# Patient Record
Sex: Male | Born: 1965 | Race: White | Hispanic: No | Marital: Married | Smoking: Never smoker
Health system: Southern US, Community
[De-identification: ages and names within clinical notes are randomized; demographics above are authoritative.]

## PROBLEM LIST (undated history)

## (undated) DIAGNOSIS — I10 Essential (primary) hypertension: Secondary | ICD-10-CM

---

## 2015-09-03 DIAGNOSIS — L0231 Cutaneous abscess of buttock: Secondary | ICD-10-CM | POA: Diagnosis not present

## 2015-09-03 DIAGNOSIS — I10 Essential (primary) hypertension: Secondary | ICD-10-CM | POA: Insufficient documentation

## 2015-09-04 ENCOUNTER — Encounter (HOSPITAL_COMMUNITY): Payer: Self-pay | Admitting: *Deleted

## 2015-09-04 ENCOUNTER — Emergency Department (HOSPITAL_COMMUNITY)
Admission: EM | Admit: 2015-09-04 | Discharge: 2015-09-04 | Disposition: A | Discharge status: Home / Self Care | Payer: BLUE CROSS/BLUE SHIELD | Attending: Emergency Medicine | Admitting: Emergency Medicine

## 2015-09-04 DIAGNOSIS — L0231 Cutaneous abscess of buttock: Secondary | ICD-10-CM

## 2015-09-04 HISTORY — DX: Essential (primary) hypertension: I10

## 2015-09-04 MED ORDER — SULFAMETHOXAZOLE-TRIMETHOPRIM 800-160 MG PO TABS: 1.0000 | ORAL_TABLET | Freq: Two times a day (BID) | ORAL | Status: AC

## 2015-09-04 MED ORDER — CEPHALEXIN 500 MG PO CAPS: 500.0000 mg | ORAL_CAPSULE | Freq: Four times a day (QID) | ORAL | Status: AC

## 2015-09-04 MED ORDER — LIDOCAINE HCL 2 % IJ SOLN
10.0000 mL | Freq: Once | INTRAMUSCULAR | Status: AC
  Administered 2015-09-04: 200 mg via INTRADERMAL
  Filled 2015-09-04: qty 20

## 2015-09-04 MED ORDER — LIDOCAINE HCL (PF) 1 % IJ SOLN: 5.0000 mL | Freq: Once | INTRAMUSCULAR | Status: DC

## 2015-09-04 NOTE — ED Provider Notes (Signed)
CSN: [id number]     Arrival date & time 09/03/15  2355 History  By signing my name below, I, Freida Busman, attest that this documentation has been prepared under the direction and in the presence of Geoffery Lyons, MD . Electronically Signed: Freida Busman, Scribe. 09/04/2015. 12:34 AM.     Chief Complaint  Patient presents with  . Abscess   The history is provided by the patient. No language interpreter was used.    HPI Comments:  James Pollard is a 49 y.o. male who presents to the Emergency Department complaining of swelling, redness and increased warmth to his right buttocks which he first noticed yesterday (09/03/2015) am. He describes a pressure sensation to the area.  He states it started as a  Itching pimple. When he returned from work it had developed into "lump" . He states it has becomes more red and grown larger since that time. No alleviating factors or associated symptoms noted.   Past Medical History  Diagnosis Date  . Hypertension    History reviewed. No pertinent past surgical history. No family history on file. Social History  Substance Use Topics  . Smoking status: Never Smoker   . Smokeless tobacco: None  . Alcohol Use: Yes    Review of Systems  Constitutional: Negative for fever and chills.  Skin: Positive for color change (erythema) and rash.  All other systems reviewed and are negative.   Allergies  Morphine and related  Home Medications   Prior to Admission medications   Not on File   BP 124/91 mmHg  Pulse 86  Temp(Src) 97.9 F (36.6 C)  Resp 18  Ht  (1.753 m)  Wt 202 lb 3 oz (91.712 kg)  BMI 29.84 kg/m2  SpO2 97% Physical Exam  Constitutional: He is oriented to person, place, and time. He appears well-developed and well-nourished. No distress.  HENT:  Head: Normocephalic and atraumatic.  Eyes: EOM are normal.  Neck: Normal range of motion.  Pulmonary/Chest: Effort normal.  Musculoskeletal: Normal range of motion.  Neurological: He is  alert and oriented to person, place, and time.  Skin: Skin is warm and dry.  There is an area of induration, erythema, and warmth to the medial aspect of the left buttock.  Psychiatric: He has a normal mood and affect. Judgment normal.  Nursing note and vitals reviewed.   ED Course  Procedures   DIAGNOSTIC STUDIES:  Oxygen Saturation is 97% on RA, normal by my interpretation.    COORDINATION OF CARE:  12:44 AM Discussed treatment plan with pt at bedside and pt agreed to plan.  12:50 AM INCISION AND DRAINAGE PROCEDURE NOTE: Patient identification was confirmed and verbal consent was obtained. This procedure was performed by Geoffery Lyons, MD at 12:50 AM. Site: left buttock Sterile procedures observed yes Needle size: 27 guage Anesthetic used (type and amt): 2% lidocaine without epi Blade size: #11 Drainage: mild Complexity: Complex Packing used: none Site anesthetized, incision made over site, wound drained and explored loculations, rinsed with copious amounts of normal saline, wound packed with sterile gauze, covered with dry, sterile dressing.  Pt tolerated procedure well without complications.  Instructions for care discussed verbally and pt provided with additional written instructions for homecare and f/u.  Labs Review Labs Reviewed - No data to display  Imaging Review No results found. I have personally reviewed and evaluated these images and lab results as part of my medical decision-making.   EKG Interpretation None  MDM   Final diagnoses:  None    Patient presents with what appears to be an abscess to the left buttock. This was incised and drained of a slight amount of pus. There is some induration and erythema of the overlying skin. This will be treated with Keflex and Bactrim and warm soaks. He is to return as needed if worsening.  I personally performed the services described in this documentation, which was scribed in my presence. The  recorded information has been reviewed and is accurate.      Geoffery Lyons, MD 09/04/15 0100

## 2015-09-04 NOTE — ED Notes (Signed)
The pt has an infected area rt buttocks since this am and the area has doubled or tripled in size since this am and is hard  Becoming more painful  also

## 2015-09-04 NOTE — Discharge Instructions (Signed)
Keflex and Bactrim as prescribed.  Perform sitz baths or warm soaks as frequently as possible for the next 2 days.  Return to the emergency department if symptoms significantly worsen or change.   Abscess An abscess is an infected area that contains a collection of pus and debris.It can occur in almost any part of the body. An abscess is also known as a furuncle or boil. CAUSES  An abscess occurs when tissue gets infected. This can occur from blockage of oil or sweat glands, infection of hair follicles, or a minor injury to the skin. As the body tries to fight the infection, pus collects in the area and creates pressure under the skin. This pressure causes pain. People with weakened immune systems have difficulty fighting infections and get certain abscesses more often.  SYMPTOMS Usually an abscess develops on the skin and becomes a painful mass that is red, warm, and tender. If the abscess forms under the skin, you may feel a moveable soft area under the skin. Some abscesses break open (rupture) on their own, but most will continue to get worse without care. The infection can spread deeper into the body and eventually into the bloodstream, causing you to feel ill.  DIAGNOSIS  Your caregiver will take your medical history and perform a physical exam. A sample of fluid may also be taken from the abscess to determine what is causing your infection. TREATMENT  Your caregiver may prescribe antibiotic medicines to fight the infection. However, taking antibiotics alone usually does not cure an abscess. Your caregiver may need to make a small cut (incision) in the abscess to drain the pus. In some cases, gauze is packed into the abscess to reduce pain and to continue draining the area. HOME CARE INSTRUCTIONS   Only take over-the-counter or prescription medicines for pain, discomfort, or fever as directed by your caregiver.  If you were prescribed antibiotics, take them as directed. Finish them even if  you start to feel better.  If gauze is used, follow your caregiver's directions for changing the gauze.  To avoid spreading the infection:  Keep your draining abscess covered with a bandage.  Wash your hands well.  Do not share personal care items, towels, or whirlpools with others.  Avoid skin contact with others.  Keep your skin and clothes clean around the abscess.  Keep all follow-up appointments as directed by your caregiver. SEEK MEDICAL CARE IF:   You have increased pain, swelling, redness, fluid drainage, or bleeding.  You have muscle aches, chills, or a general ill feeling.  You have a fever. MAKE SURE YOU:   Understand these instructions.  Will watch your condition.  Will get help right away if you are not doing well or get worse. Document Released: 09/01/2005 Document Revised: 05/23/2012 Document Reviewed: 02/04/2012 [facility] Patient Information 2015 [place], [place]. This information is not intended to replace advice given to you by your health care provider. Make sure you discuss any questions you have with your health care provider.

## 2015-11-19 ENCOUNTER — Other Ambulatory Visit: Payer: Self-pay | Admitting: Family Medicine

## 2015-11-19 DIAGNOSIS — R1011 Right upper quadrant pain: Secondary | ICD-10-CM

## 2015-11-28 ENCOUNTER — Ambulatory Visit
Admission: RE | Admit: 2015-11-28 | Discharge: 2015-11-28 | Disposition: A | Discharge status: Home / Self Care | Payer: Managed Care, Other (non HMO) | Source: Ambulatory Visit | Attending: Family Medicine | Admitting: Family Medicine

## 2015-11-28 DIAGNOSIS — R1011 Right upper quadrant pain: Secondary | ICD-10-CM

## 2018-01-30 ENCOUNTER — Other Ambulatory Visit: Payer: Self-pay | Admitting: Family Medicine

## 2018-01-30 DIAGNOSIS — R109 Unspecified abdominal pain: Secondary | ICD-10-CM

## 2018-02-06 ENCOUNTER — Other Ambulatory Visit: Payer: Self-pay | Admitting: Family Medicine

## 2018-02-08 ENCOUNTER — Other Ambulatory Visit: Payer: Managed Care, Other (non HMO)

## 2018-02-10 ENCOUNTER — Ambulatory Visit
Admission: RE | Admit: 2018-02-10 | Discharge: 2018-02-10 | Disposition: A | Discharge status: Home / Self Care | Payer: Managed Care, Other (non HMO) | Source: Ambulatory Visit | Attending: Family Medicine | Admitting: Family Medicine

## 2018-02-10 DIAGNOSIS — R109 Unspecified abdominal pain: Secondary | ICD-10-CM

## 2018-06-22 ENCOUNTER — Encounter: Payer: Self-pay | Admitting: Family Medicine

## 2018-08-11 IMAGING — US US ABDOMEN COMPLETE
1 series · 13 of 25 positions shown · non-contrast
Comparison: Abdominal ultrasound 11/28/2015.

CLINICAL DATA: Indigestion and gas after eating for several years.

EXAM:
ABDOMEN ULTRASOUND COMPLETE

[Series 1: us abdomen complete · 0.23mm/px · 96 acquisitions, 13 frames shown]
[im 1/96]
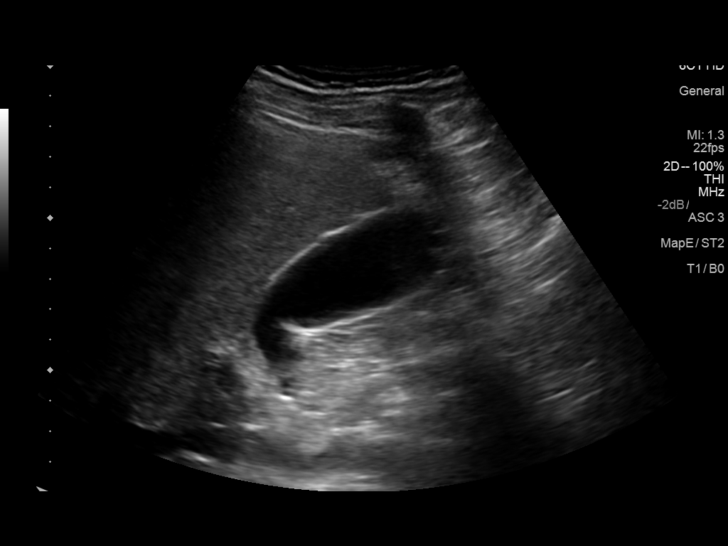
[im 8/96]
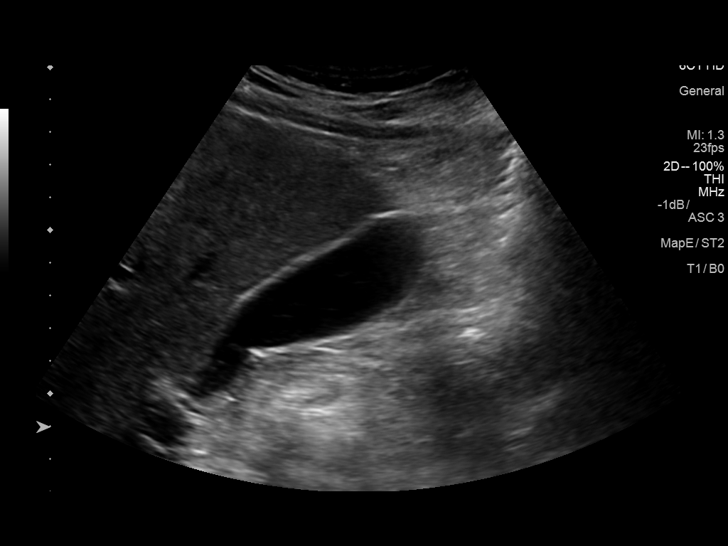
[im 16/96]
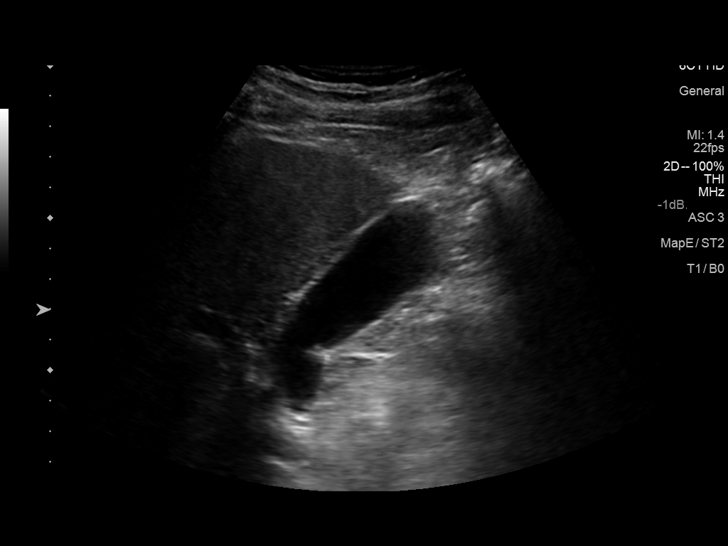
[im 24/96]
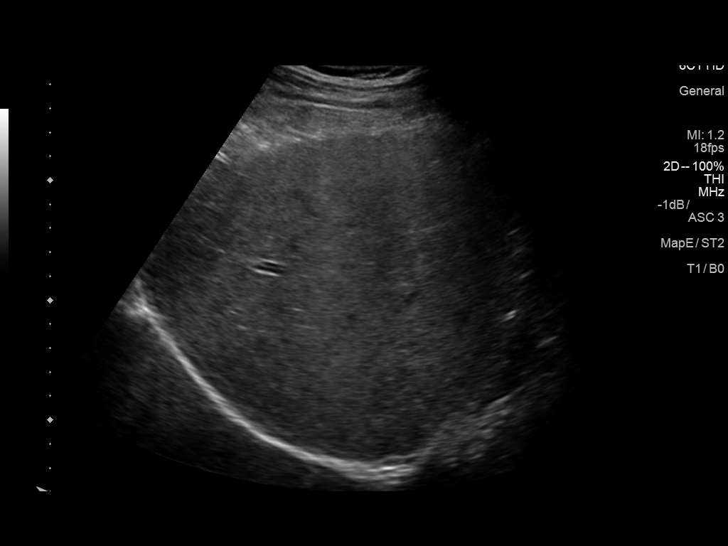
[im 32/96]
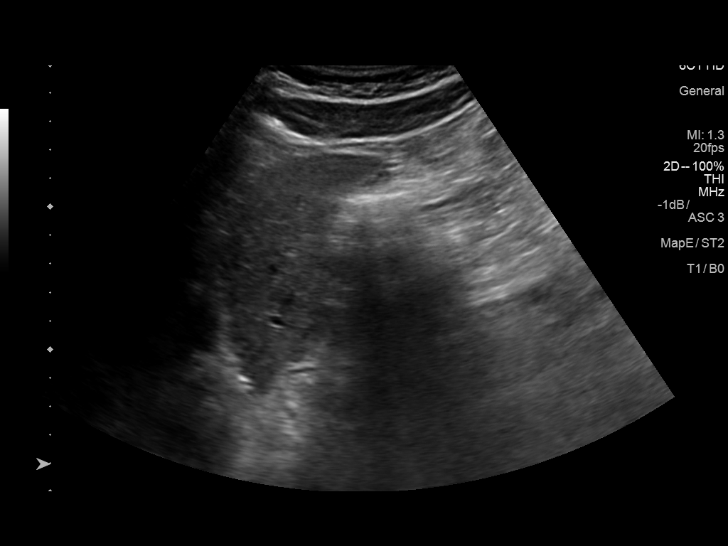
[im 40/96]
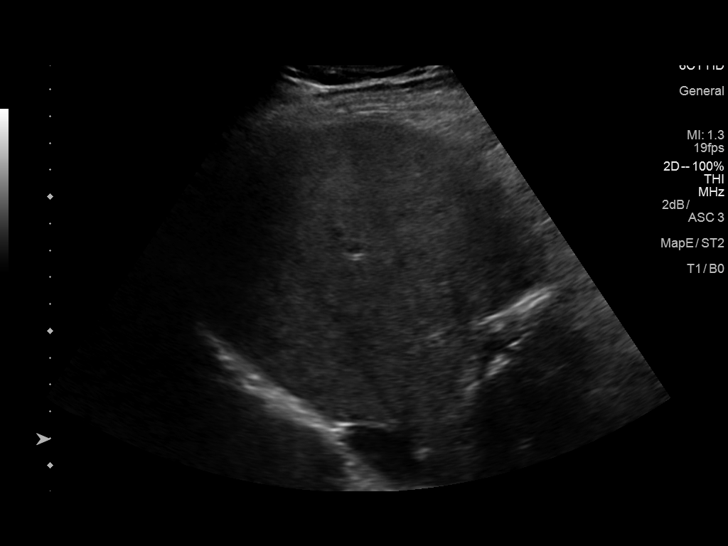
[im 48/96]
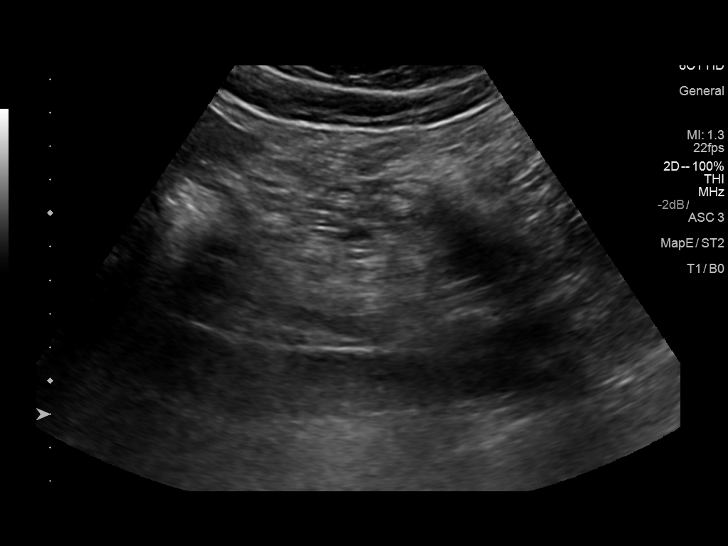
[im 56/96]
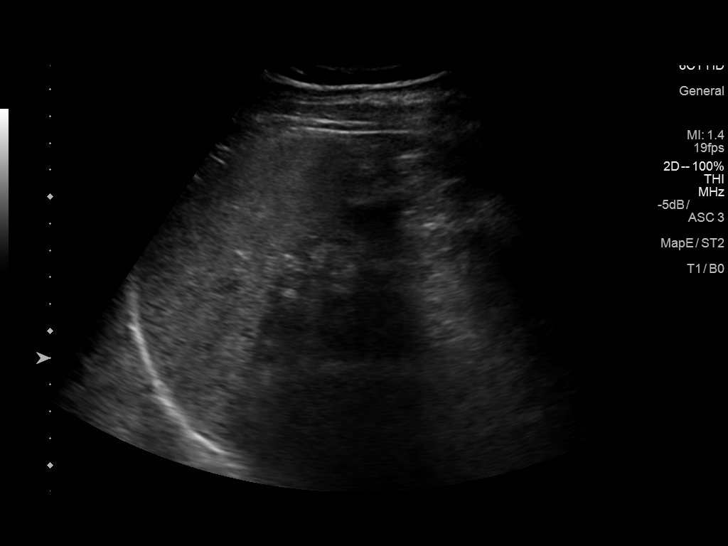
[im 64/96]
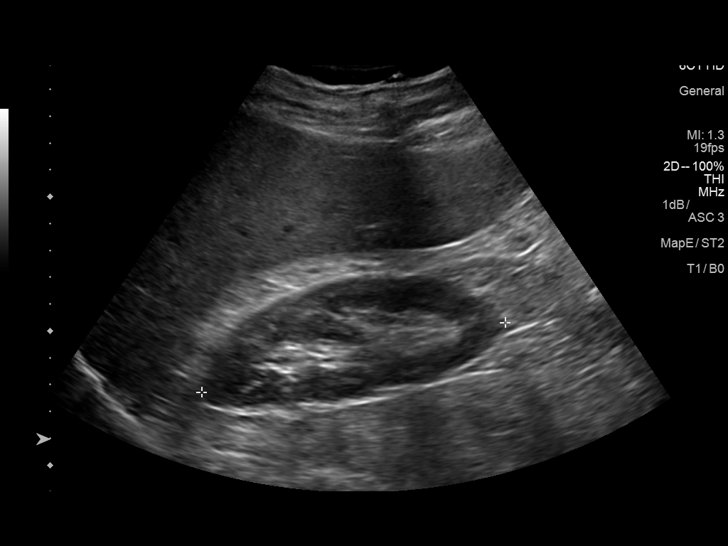
[im 72/96]
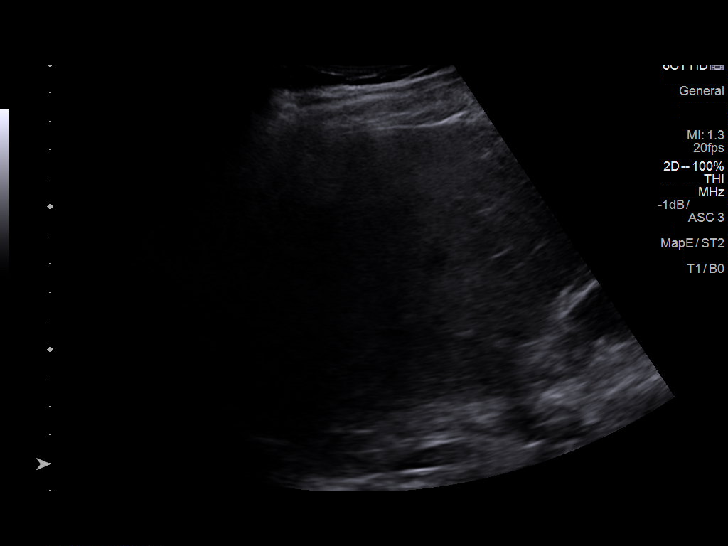
[im 80/96]
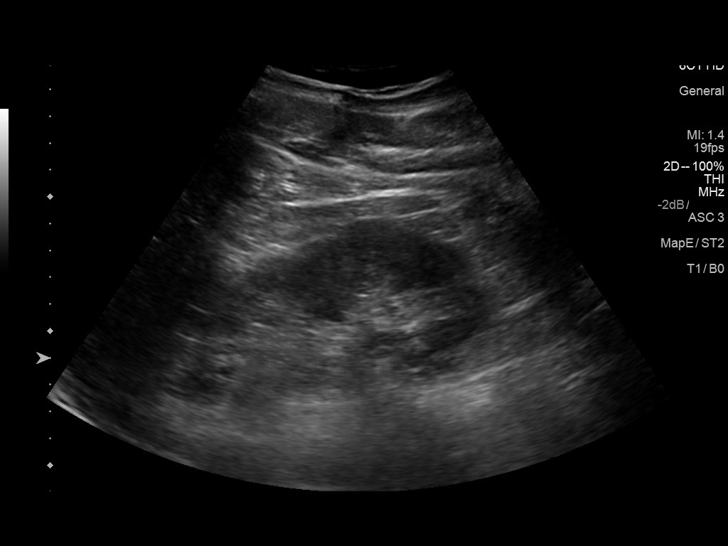
[im 88/96]
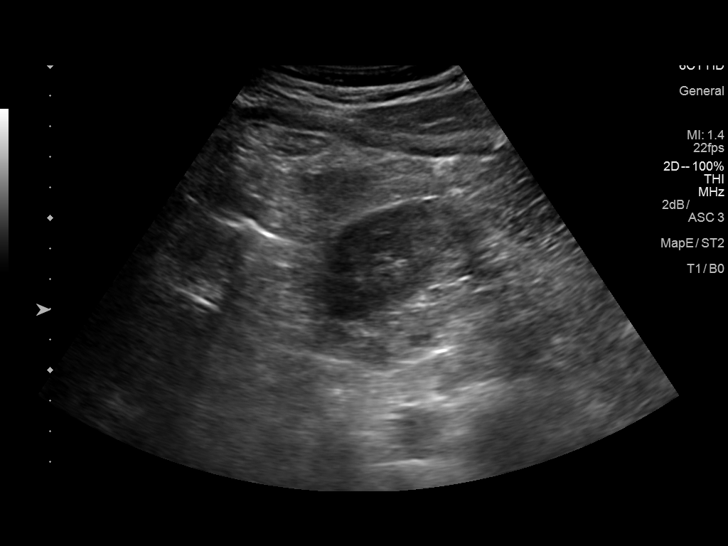
[im 96/96]
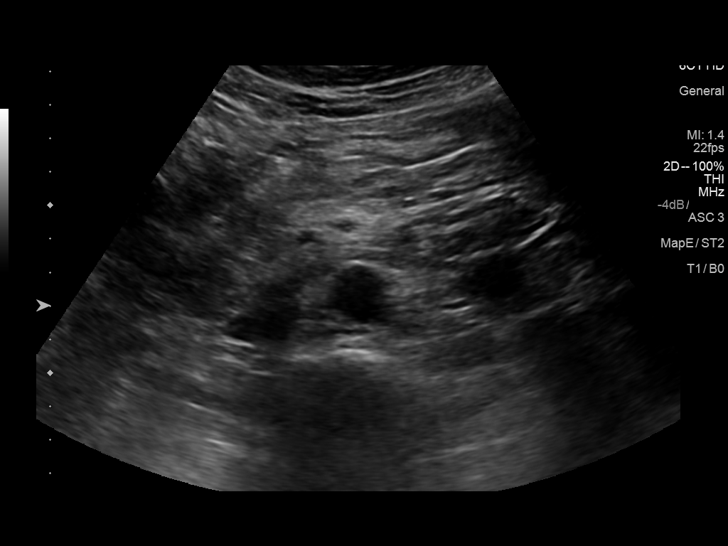

[13 of 25 positions shown; findings below may reference images not displayed]

FINDINGS: Gallbladder: No gallstones or wall thickening visualized. No
sonographic Murphy sign noted by sonographer.

Common bile duct: Diameter: 0.2 cm

Liver: 1.1 cm in diameter echogenic lesion in the liver consistent
with a hemangioma is unchanged compared to the prior examination.
The other echogenic lesion seen on the prior examination is not
visualized on this study. No focal lesion is seen. Portal vein is
patent on color Doppler imaging with normal direction of blood flow
towards the liver.

IVC: No abnormality visualized.

Pancreas: Visualized portion unremarkable.

Spleen: Size and appearance within normal limits.

Right Kidney: Length: 11.6 cm. Echogenicity within normal limits. No
mass or hydronephrosis visualized.

Left Kidney: Length: 13.4 cm. Echogenicity within normal limits. No
mass or hydronephrosis visualized.

Abdominal aorta: No aneurysm visualized.

Other findings: None.
IMPRESSION: Negative for gallstones.  No acute abnormality.

Echogenic lesion in the liver consistent with a hemangioma is
identified as on the prior study. Second hemangioma seen on the
prior examination is not visualized on this exam.

## 2019-07-16 ENCOUNTER — Encounter: Payer: Self-pay | Admitting: Gastroenterology

## 2019-08-15 ENCOUNTER — Encounter: Payer: Managed Care, Other (non HMO) | Admitting: Gastroenterology
